# Patient Record
Sex: Male | Born: 1995 | Race: White | Hispanic: No | Marital: Single | Smoking: Never smoker
Health system: Southern US, Community
[De-identification: ages and names within clinical notes are randomized; demographics above are authoritative.]

## PROBLEM LIST (undated history)

## (undated) DIAGNOSIS — S42309A Unspecified fracture of shaft of humerus, unspecified arm, initial encounter for closed fracture: Secondary | ICD-10-CM

---
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
## 2019-02-18 ENCOUNTER — Other Ambulatory Visit: Payer: Self-pay

## 2019-02-18 DIAGNOSIS — Z20822 Contact with and (suspected) exposure to covid-19: Secondary | ICD-10-CM

## 2019-02-19 LAB — NOVEL CORONAVIRUS, NAA: SARS-CoV-2, NAA: NOT DETECTED

## 2019-07-01 ENCOUNTER — Other Ambulatory Visit: Payer: Self-pay
